# Patient Record
Sex: Female | Born: 1995 | Hispanic: Yes | Marital: Single
Health system: Southern US, Community
[De-identification: ages and names within clinical notes are randomized; demographics above are authoritative.]

---
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
## 2016-03-19 IMAGING — CR DG HAND COMPLETE 3+V*L*
1 series · 3 of 3 positions shown · non-contrast
Comparison: None.

CLINICAL DATA: Palpable bump on posterior portion of left hand.

EXAM:
LEFT HAND - COMPLETE 3+ VIEW

[Series 1: dxr hand lt complete  w/obliques · 0.14mm/px · 3 of 3 slices shown]
[im 1/3]
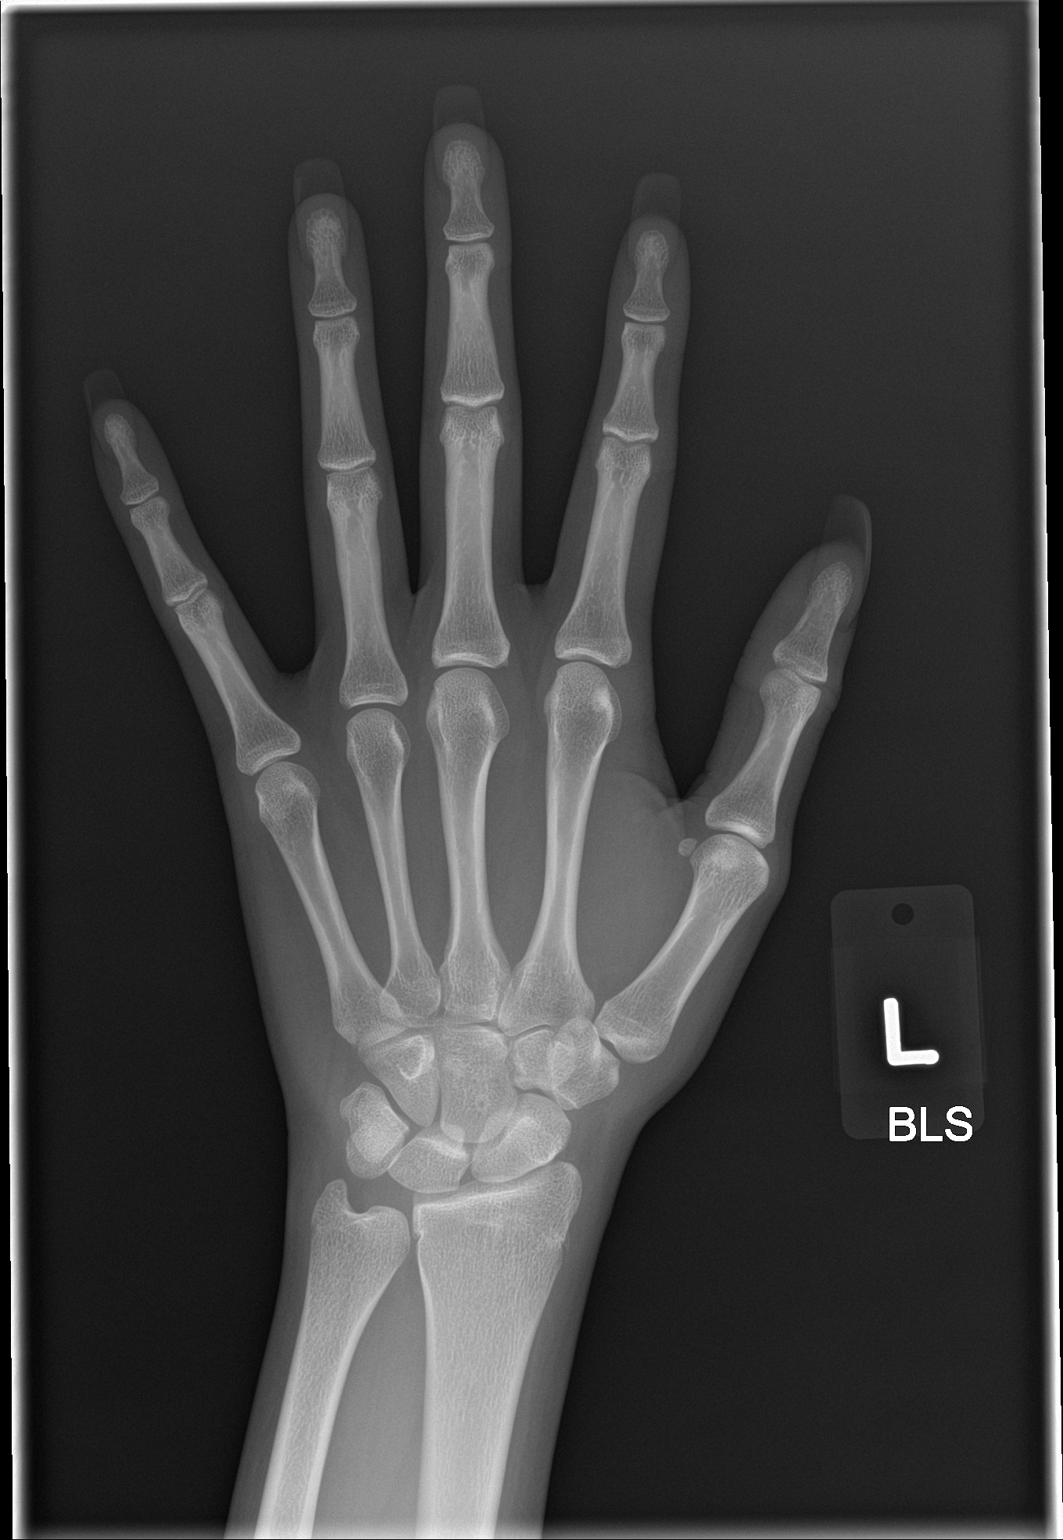
[im 2/3]
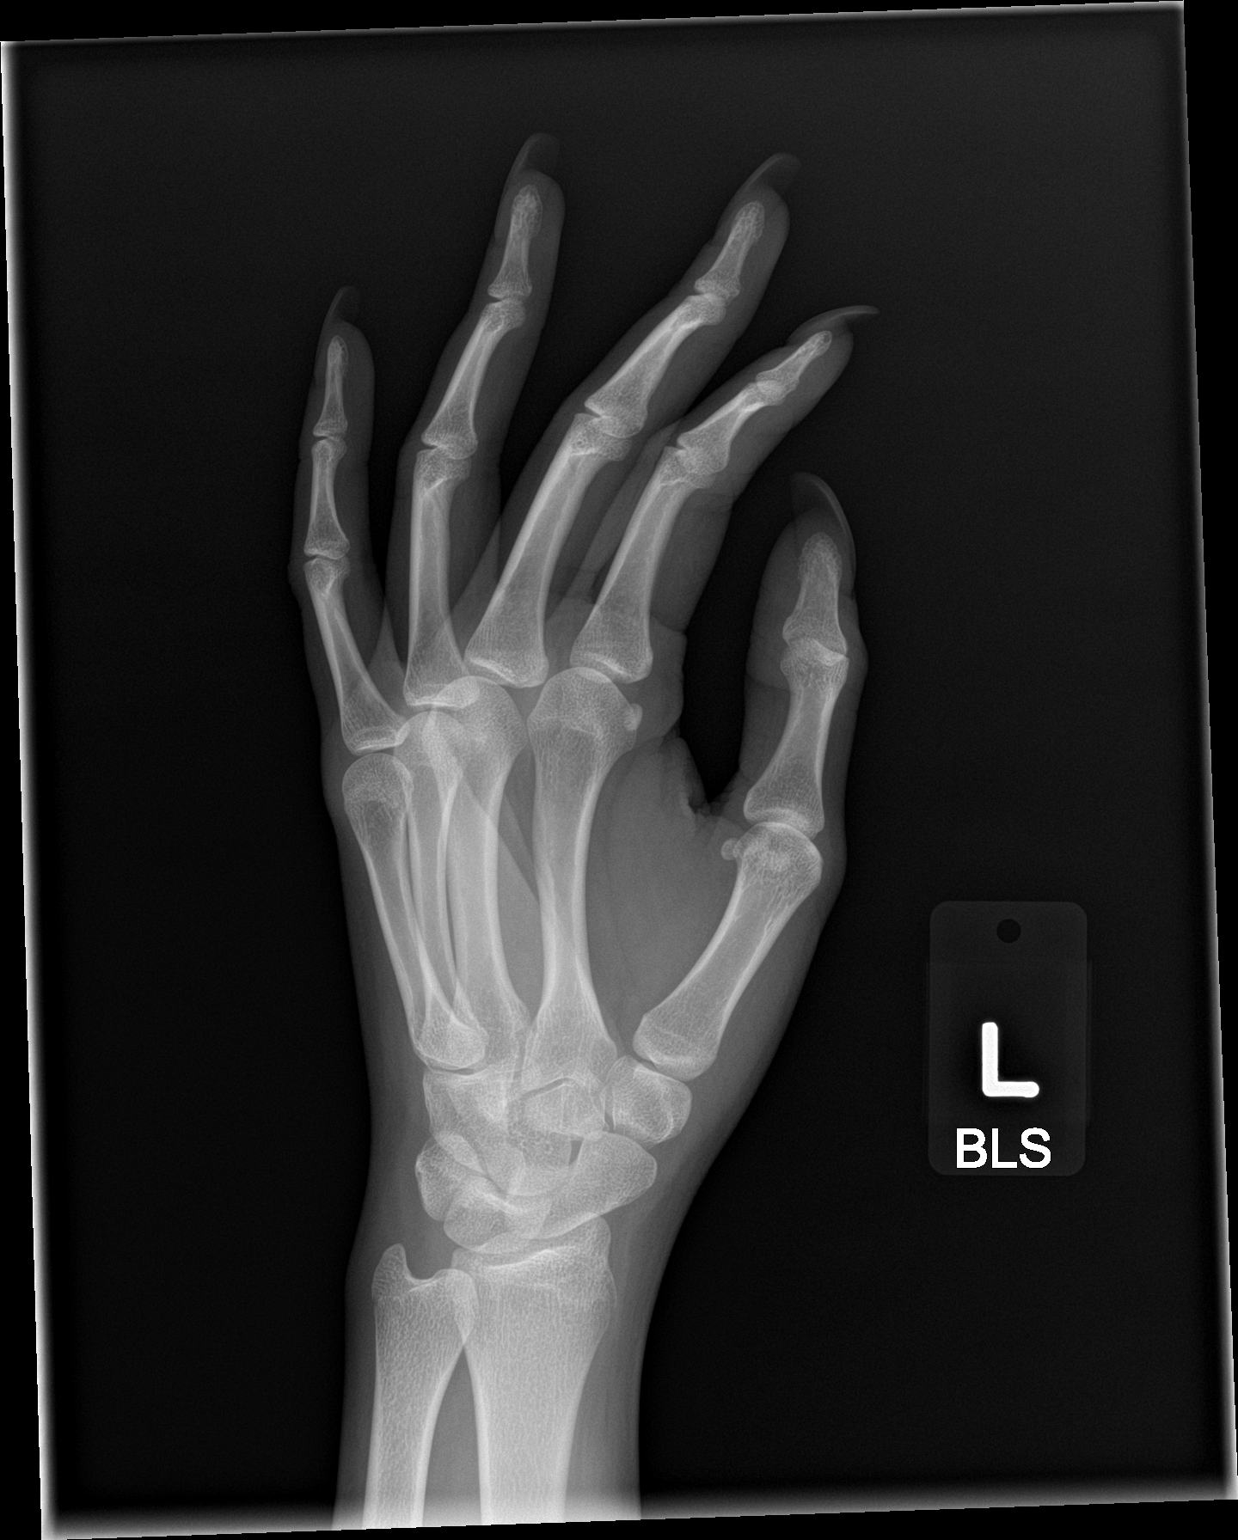
[im 3/3]
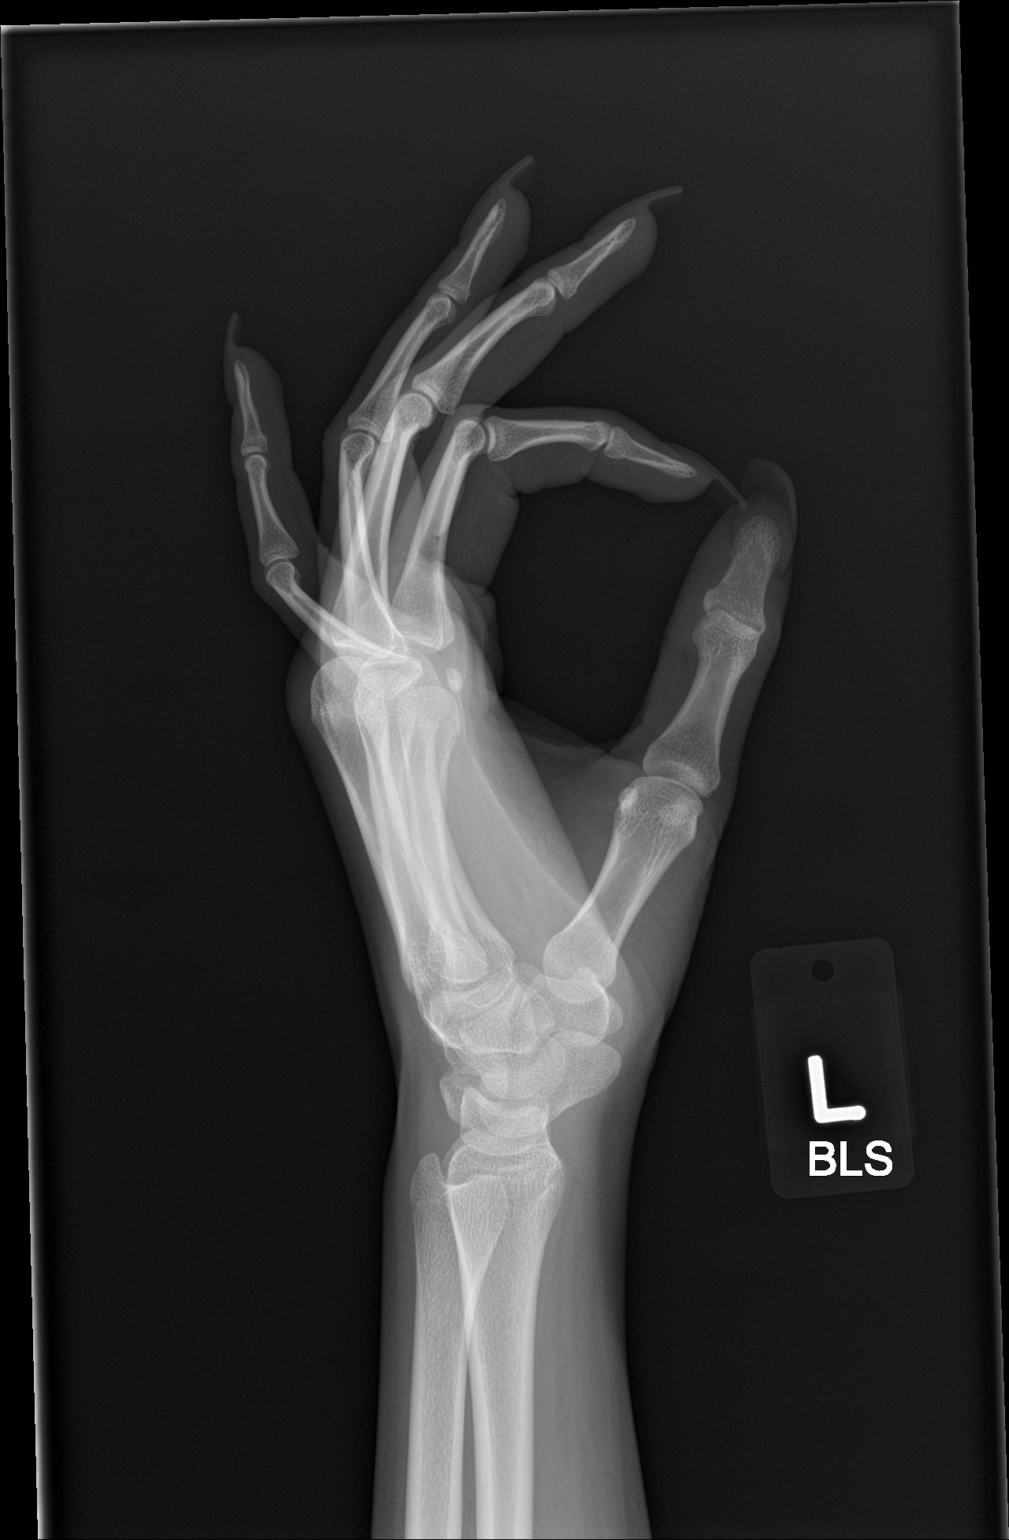

[3 of 3 positions shown; findings below may reference images not displayed]

FINDINGS: There is no evidence of fracture or dislocation. There is no
evidence of arthropathy or other focal bone abnormality. Soft
tissues are unremarkable.
IMPRESSION: Normal left hand.
